# Patient Record
Sex: Male | Born: 1995 | Race: Black or African American | Hispanic: No | Marital: Single | Smoking: Never smoker
Health system: Southern US, Community
[De-identification: ages and names within clinical notes are randomized; demographics above are authoritative.]

## PROBLEM LIST (undated history)

## (undated) HISTORY — PX: NO PAST SURGERIES: SHX2092

---
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
## 2015-05-02 IMAGING — CR DG ANKLE COMPLETE 3+V*L*
3 series · 3 of 3 positions shown · non-contrast
Comparison: None.

CLINICAL DATA: Rolled ankle with pain laterally with swelling

EXAM:
LEFT ANKLE COMPLETE - 3+ VIEW

[view not recorded (1 of 3)]
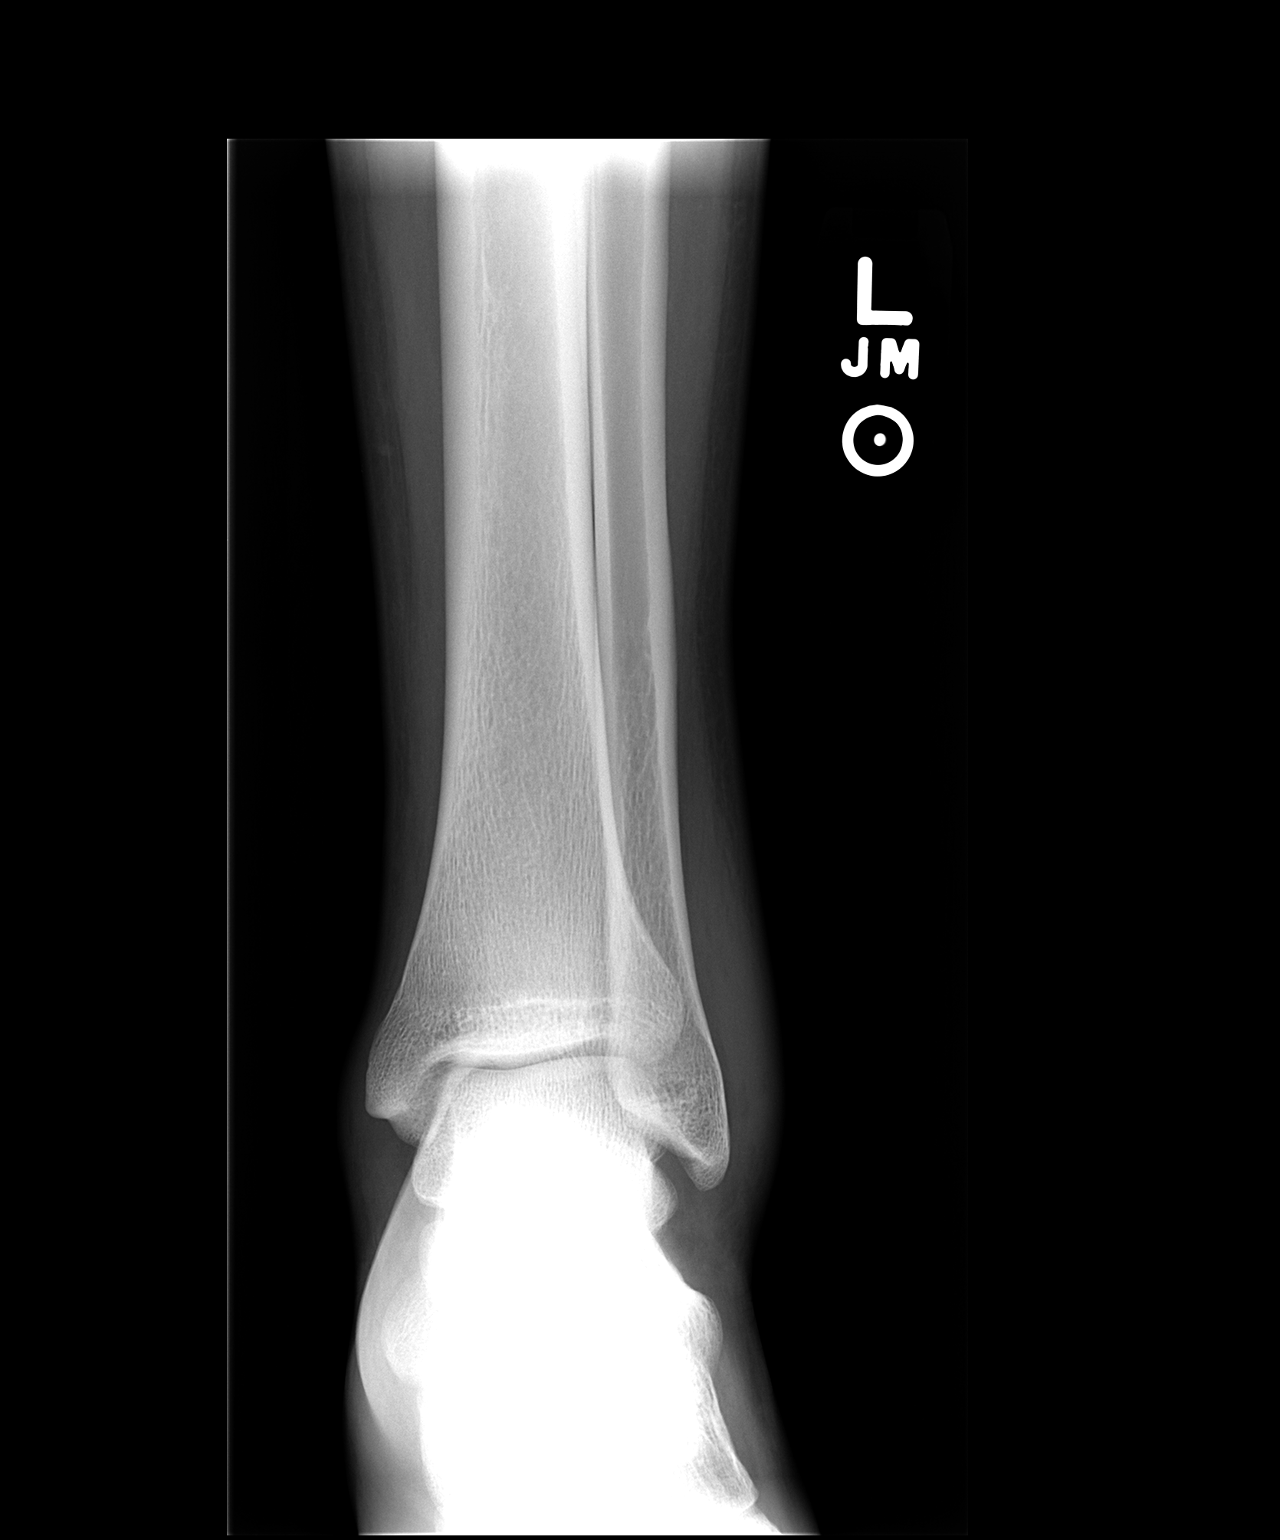

[view not recorded (2 of 3)]
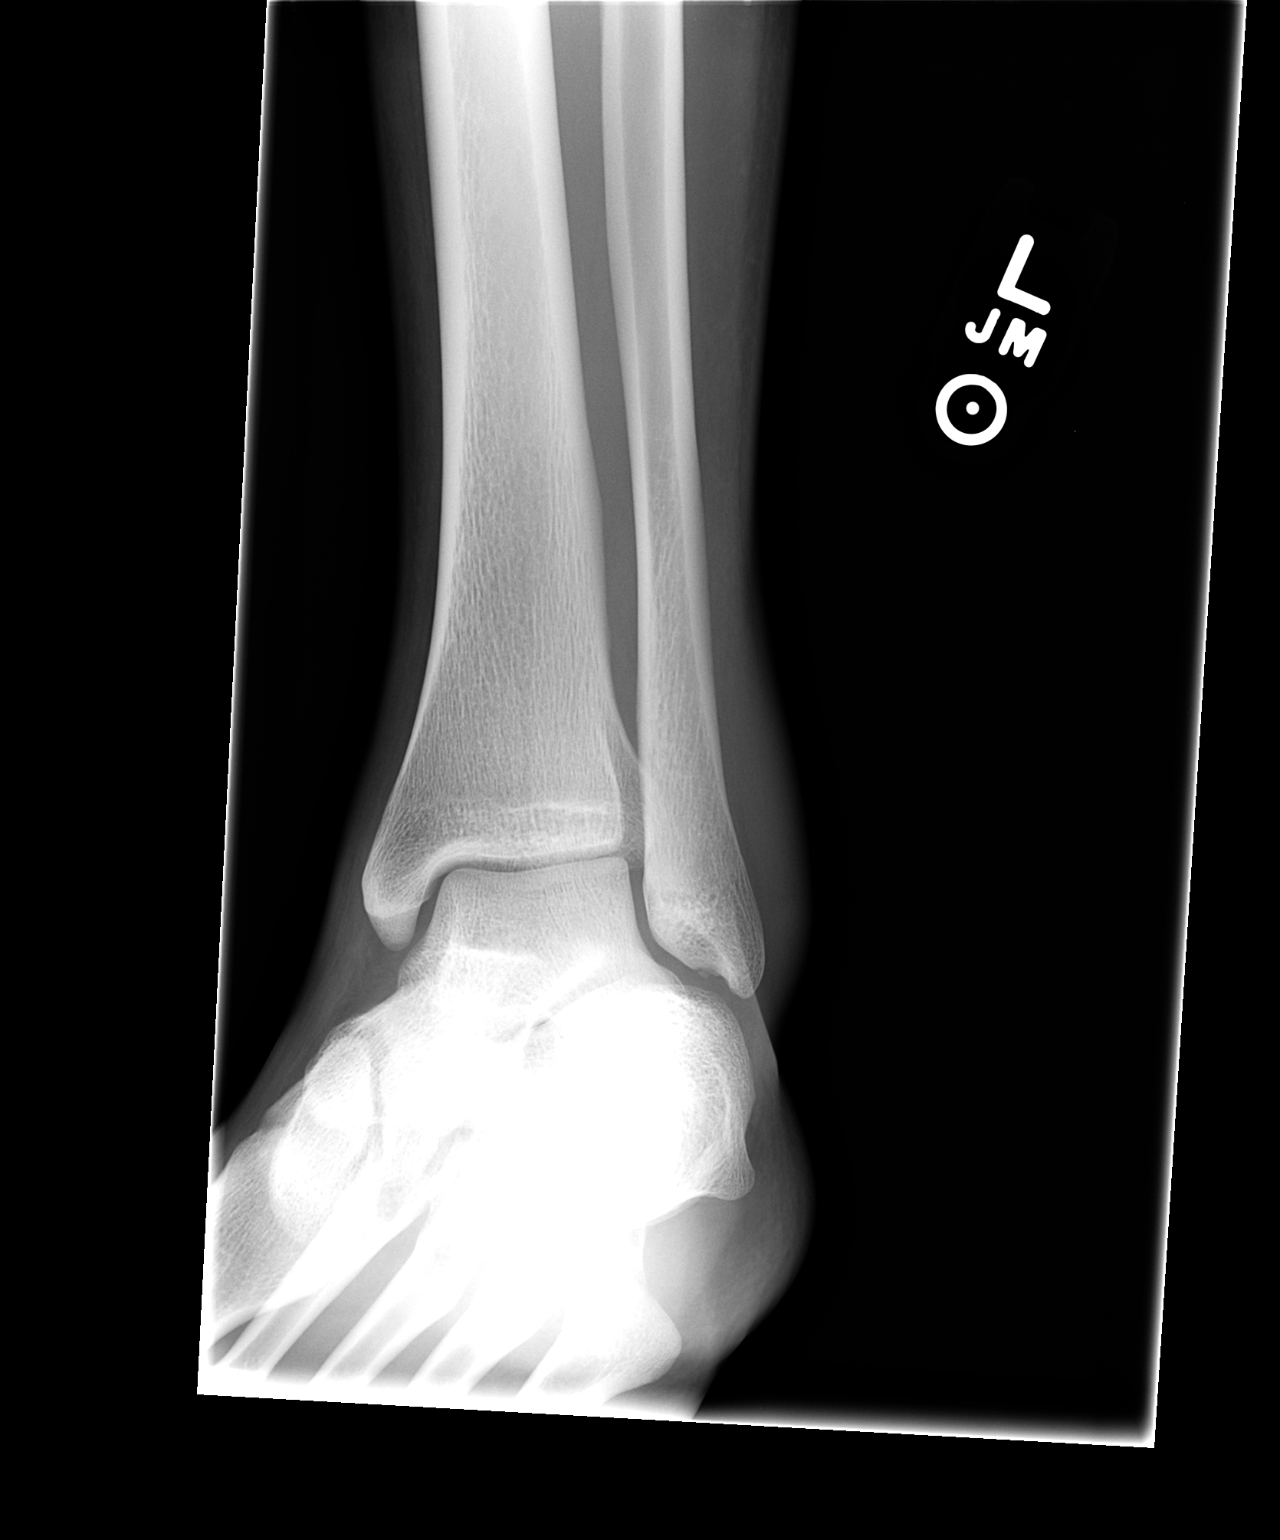

[view not recorded (3 of 3)]
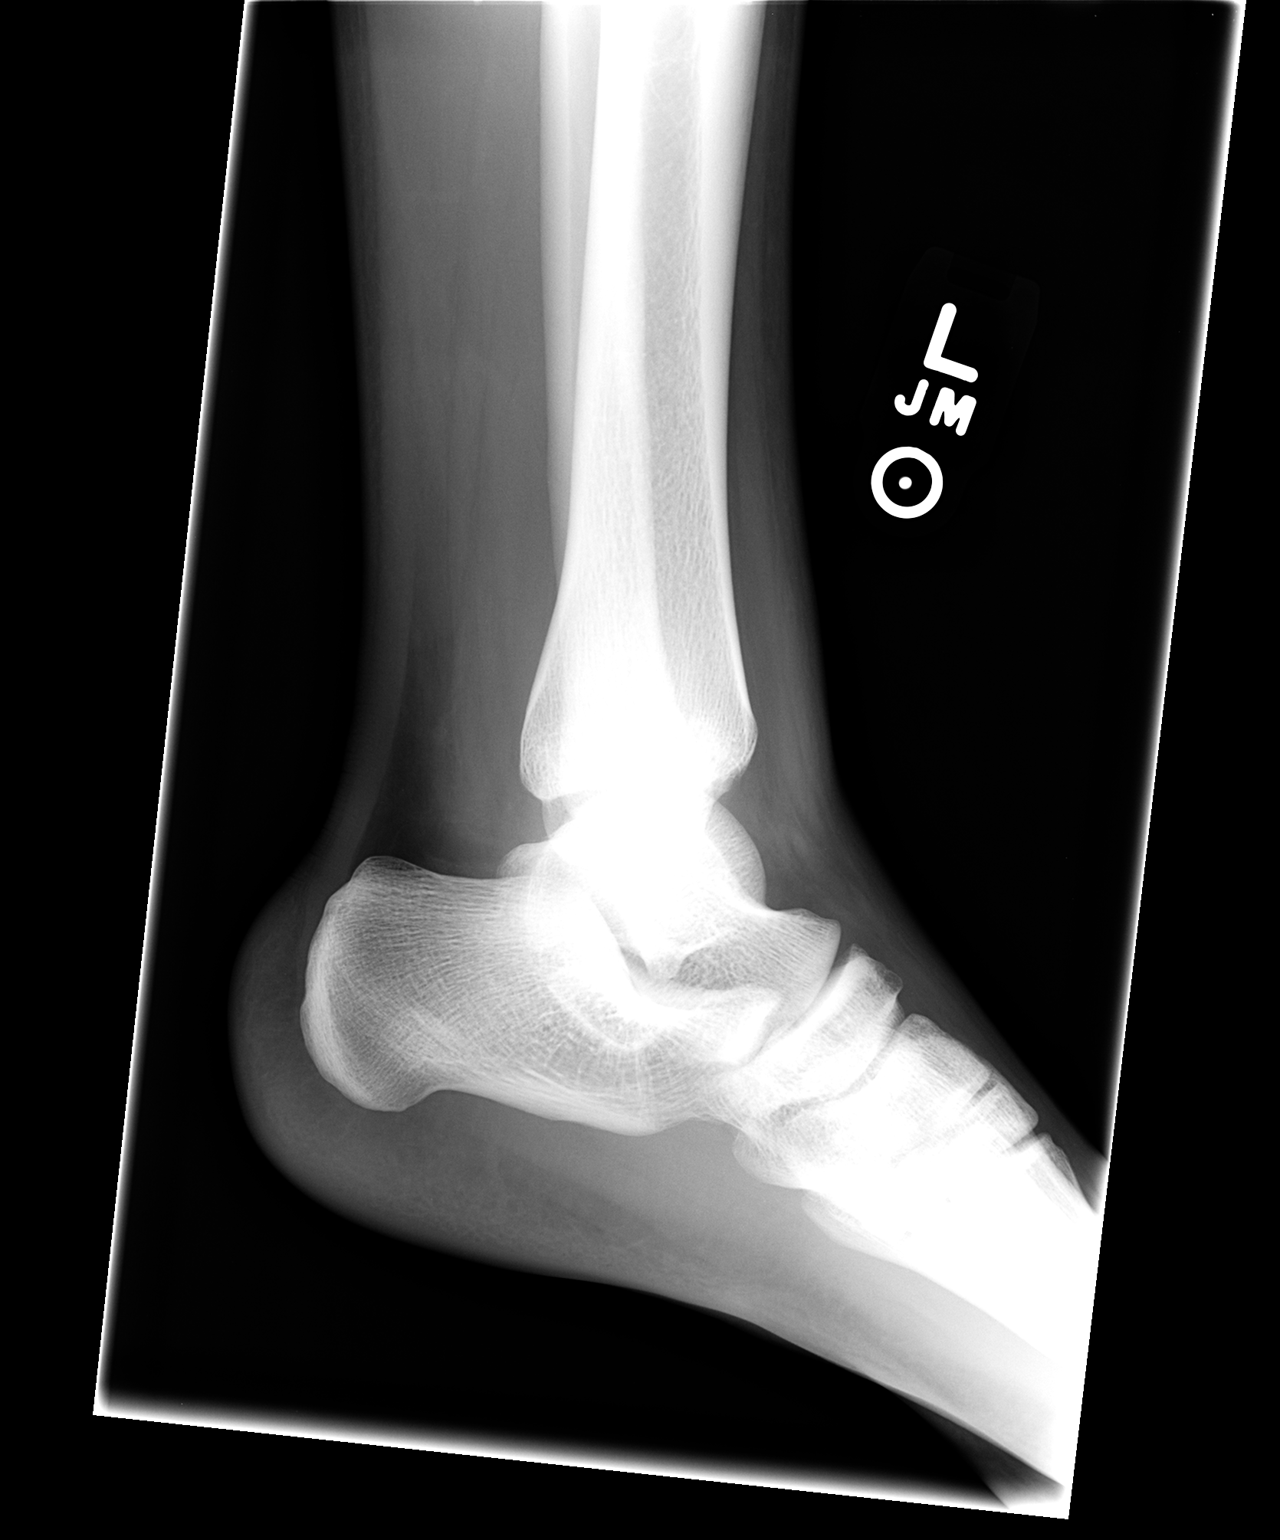

[3 of 3 positions shown; findings below may reference images not displayed]

FINDINGS: There is soft tissue swelling over the lateral malleolus. However no
fracture is seen. The ankle joint appears normal, and alignment is
normal.
IMPRESSION: Negative.
